# Patient Record
Sex: Male | Born: 2000 | Race: Black or African American | Hispanic: No | Marital: Single | Smoking: Never smoker
Health system: Southern US, Community
[De-identification: ages and names within clinical notes are randomized; demographics above are authoritative.]

---

## 2022-01-01 ENCOUNTER — Encounter
Admission: EM | Disposition: A | Discharge status: Home / Self Care | Payer: Self-pay | Source: Home / Self Care | Attending: Emergency Medicine

## 2022-01-01 ENCOUNTER — Ambulatory Visit
Admission: EM | Admit: 2022-01-01 | Discharge: 2022-01-01 | Disposition: A | Discharge status: Home / Self Care | Payer: 59 | Attending: Emergency Medicine | Admitting: Emergency Medicine

## 2022-01-01 ENCOUNTER — Emergency Department: Payer: 59

## 2022-01-01 ENCOUNTER — Emergency Department: Payer: 59 | Admitting: Anesthesiology

## 2022-01-01 ENCOUNTER — Other Ambulatory Visit: Payer: Self-pay

## 2022-01-01 ENCOUNTER — Encounter: Payer: Self-pay | Admitting: Emergency Medicine

## 2022-01-01 DIAGNOSIS — N4400 Torsion of testis, unspecified: Secondary | ICD-10-CM | POA: Diagnosis not present

## 2022-01-01 DIAGNOSIS — R112 Nausea with vomiting, unspecified: Secondary | ICD-10-CM | POA: Diagnosis not present

## 2022-01-01 DIAGNOSIS — Z20822 Contact with and (suspected) exposure to covid-19: Secondary | ICD-10-CM | POA: Insufficient documentation

## 2022-01-01 DIAGNOSIS — N50812 Left testicular pain: Secondary | ICD-10-CM | POA: Insufficient documentation

## 2022-01-01 DIAGNOSIS — R609 Edema, unspecified: Secondary | ICD-10-CM

## 2022-01-01 HISTORY — PX: TESTICLE TORSION REDUCTION: SHX795

## 2022-01-01 LAB — BASIC METABOLIC PANEL
Anion gap: 8 (ref 5–15)
BUN: 13 mg/dL (ref 6–20)
CO2: 25 mmol/L (ref 22–32)
Calcium: 9 mg/dL (ref 8.9–10.3)
Chloride: 106 mmol/L (ref 98–111)
Creatinine, Ser: 1.21 mg/dL (ref 0.61–1.24)
GFR, Estimated: >60 mL/min (ref >60)
Glucose, Bld: 145 mg/dL — ABNORMAL HIGH (ref 70–99)
Potassium: 4.2 mmol/L (ref 3.5–5.1)
Sodium: 139 mmol/L (ref 135–145)

## 2022-01-01 LAB — CBC WITH DIFFERENTIAL/PLATELET
Abs Immature Granulocytes: 0.06 10*3/uL (ref 0.00–0.07)
Basophils Absolute: 0.1 10*3/uL (ref 0.0–0.1)
Basophils Relative: 1 %
Eosinophils Absolute: 0.1 10*3/uL (ref 0.0–0.5)
Eosinophils Relative: 1 %
HCT: 45.6 % (ref 39.0–52.0)
Hemoglobin: 14.6 g/dL (ref 13.0–17.0)
Immature Granulocytes: 1 %
Lymphocytes Relative: 14 %
Lymphs Abs: 1.2 10*3/uL (ref 0.7–4.0)
MCH: 27.4 pg (ref 26.0–34.0)
MCHC: 32 g/dL (ref 30.0–36.0)
MCV: 85.7 fL (ref 80.0–100.0)
Monocytes Absolute: 0.4 10*3/uL (ref 0.1–1.0)
Monocytes Relative: 5 %
Neutro Abs: 6.8 10*3/uL (ref 1.7–7.7)
Neutrophils Relative %: 78 %
Platelets: 197 10*3/uL (ref 150–400)
RBC: 5.32 MIL/uL (ref 4.22–5.81)
RDW: 12.4 % (ref 11.5–15.5)
WBC: 8.7 10*3/uL (ref 4.0–10.5)
nRBC: 0 % (ref 0.0–0.2)

## 2022-01-01 LAB — URINALYSIS, ROUTINE W REFLEX MICROSCOPIC
Bacteria, UA: NONE SEEN
Bilirubin Urine: NEGATIVE
Glucose, UA: NEGATIVE mg/dL
Hgb urine dipstick: NEGATIVE
Ketones, ur: NEGATIVE mg/dL
Leukocytes,Ua: NEGATIVE
Nitrite: NEGATIVE
Protein, ur: 30 mg/dL — AB
Specific Gravity, Urine: 1.02 (ref 1.005–1.030)
pH: 9 — ABNORMAL HIGH (ref 5.0–8.0)

## 2022-01-01 LAB — TYPE AND SCREEN
ABO/RH(D): O POS
Antibody Screen: NEGATIVE

## 2022-01-01 LAB — CHLAMYDIA/NGC RT PCR (ARMC ONLY)
Chlamydia Tr: NOT DETECTED
N gonorrhoeae: NOT DETECTED

## 2022-01-01 LAB — PROTIME-INR
INR: 1 (ref 0.8–1.2)
Prothrombin Time: 13.1 seconds (ref 11.4–15.2)

## 2022-01-01 LAB — RESP PANEL BY RT-PCR (FLU A&B, COVID) ARPGX2
Influenza A by PCR: NEGATIVE
Influenza B by PCR: NEGATIVE
SARS Coronavirus 2 by RT PCR: NEGATIVE

## 2022-01-01 SURGERY — TESTICULAR TORSION REPAIR
Anesthesia: General | Laterality: Left

## 2022-01-01 MED ORDER — SUCCINYLCHOLINE CHLORIDE 200 MG/10ML IV SOSY
PREFILLED_SYRINGE | INTRAVENOUS | Status: DC | PRN
  Administered 2022-01-01: 100 mg via INTRAVENOUS

## 2022-01-01 MED ORDER — DEXAMETHASONE SODIUM PHOSPHATE 10 MG/ML IJ SOLN
INTRAMUSCULAR | Status: DC | PRN
Start: 2022-01-01 — End: 2022-01-01
  Administered 2022-01-01: 10 mg via INTRAVENOUS

## 2022-01-01 MED ORDER — HYDROMORPHONE HCL 1 MG/ML IJ SOLN
1.0000 mg | Freq: Once | INTRAMUSCULAR | Status: AC
  Administered 2022-01-01: 1 mg via INTRAVENOUS
  Filled 2022-01-01: qty 1

## 2022-01-01 MED ORDER — ONDANSETRON HCL 4 MG/2ML IJ SOLN
4.0000 mg | Freq: Once | INTRAMUSCULAR | Status: AC
  Administered 2022-01-01: 4 mg via INTRAVENOUS
  Filled 2022-01-01: qty 2

## 2022-01-01 MED ORDER — LIDOCAINE HCL (CARDIAC) PF 100 MG/5ML IV SOSY
PREFILLED_SYRINGE | INTRAVENOUS | Status: DC | PRN
Start: 2022-01-01 — End: 2022-01-01
  Administered 2022-01-01: 100 mg via INTRAVENOUS

## 2022-01-01 MED ORDER — MIDAZOLAM HCL 2 MG/2ML IJ SOLN
INTRAMUSCULAR | Status: DC | PRN
  Administered 2022-01-01: 2 mg via INTRAVENOUS

## 2022-01-01 MED ORDER — BUPIVACAINE HCL 0.5 % IJ SOLN
INTRAMUSCULAR | Status: DC | PRN
  Administered 2022-01-01: 10 mL

## 2022-01-01 MED ORDER — FENTANYL CITRATE (PF) 100 MCG/2ML IJ SOLN: 25.0000 ug | INTRAMUSCULAR | Status: DC | PRN

## 2022-01-01 MED ORDER — DEXMEDETOMIDINE (PRECEDEX) IN NS 20 MCG/5ML (4 MCG/ML) IV SYRINGE
PREFILLED_SYRINGE | INTRAVENOUS | Status: DC | PRN
  Administered 2022-01-01: 4 ug via INTRAVENOUS
  Administered 2022-01-01: 8 ug via INTRAVENOUS

## 2022-01-01 MED ORDER — FENTANYL CITRATE (PF) 100 MCG/2ML IJ SOLN
INTRAMUSCULAR | Status: AC
  Filled 2022-01-01: qty 2

## 2022-01-01 MED ORDER — ACETAMINOPHEN 10 MG/ML IV SOLN
INTRAVENOUS | Status: AC
  Filled 2022-01-01: qty 100

## 2022-01-01 MED ORDER — PROPOFOL 10 MG/ML IV BOLUS
INTRAVENOUS | Status: DC | PRN
  Administered 2022-01-01: 50 mg via INTRAVENOUS
  Administered 2022-01-01: 150 mg via INTRAVENOUS

## 2022-01-01 MED ORDER — SUGAMMADEX SODIUM 200 MG/2ML IV SOLN
INTRAVENOUS | Status: DC | PRN
Start: 2022-01-01 — End: 2022-01-01
  Administered 2022-01-01: 180 mg via INTRAVENOUS

## 2022-01-01 MED ORDER — KETOROLAC TROMETHAMINE 30 MG/ML IJ SOLN
INTRAMUSCULAR | Status: DC | PRN
  Administered 2022-01-01: 30 mg via INTRAVENOUS

## 2022-01-01 MED ORDER — ROCURONIUM BROMIDE 100 MG/10ML IV SOLN
INTRAVENOUS | Status: DC | PRN
  Administered 2022-01-01: 30 mg via INTRAVENOUS

## 2022-01-01 MED ORDER — ONDANSETRON HCL 4 MG/2ML IJ SOLN
INTRAMUSCULAR | Status: DC | PRN
  Administered 2022-01-01: 4 mg via INTRAVENOUS

## 2022-01-01 MED ORDER — MIDAZOLAM HCL 2 MG/2ML IJ SOLN
INTRAMUSCULAR | Status: AC
  Filled 2022-01-01: qty 2

## 2022-01-01 MED ORDER — 0.9 % SODIUM CHLORIDE (POUR BTL) OPTIME
TOPICAL | Status: DC | PRN
Start: 2022-01-01 — End: 2022-01-01
  Administered 2022-01-01: 100 mL

## 2022-01-01 MED ORDER — SODIUM CHLORIDE 0.9 % IV SOLN
INTRAVENOUS | Status: DC
Start: 2022-01-01 — End: 2022-01-01

## 2022-01-01 MED ORDER — HYDROCODONE-ACETAMINOPHEN 5-325 MG PO TABS: 1.0000 | ORAL_TABLET | Freq: Four times a day (QID) | ORAL | 0 refills | Status: DC | PRN

## 2022-01-01 MED ORDER — FENTANYL CITRATE (PF) 100 MCG/2ML IJ SOLN
INTRAMUSCULAR | Status: DC | PRN
  Administered 2022-01-01 (×2): 50 ug via INTRAVENOUS

## 2022-01-01 MED ORDER — ACETAMINOPHEN 10 MG/ML IV SOLN
INTRAVENOUS | Status: DC | PRN
  Administered 2022-01-01: 1000 mg via INTRAVENOUS

## 2022-01-01 MED ORDER — MORPHINE SULFATE (PF) 4 MG/ML IV SOLN
4.0000 mg | Freq: Once | INTRAVENOUS | Status: AC
  Administered 2022-01-01: 4 mg via INTRAVENOUS
  Filled 2022-01-01: qty 1

## 2022-01-01 MED ORDER — ONDANSETRON HCL 4 MG/2ML IJ SOLN: 4.0000 mg | Freq: Once | INTRAMUSCULAR | Status: DC | PRN

## 2022-01-01 MED ORDER — DOCUSATE SODIUM 100 MG PO CAPS: 100.0000 mg | ORAL_CAPSULE | Freq: Two times a day (BID) | ORAL | 0 refills | Status: DC

## 2022-01-01 MED ORDER — PROPOFOL 500 MG/50ML IV EMUL
INTRAVENOUS | Status: AC
  Filled 2022-01-01: qty 50

## 2022-01-01 MED ORDER — CEFAZOLIN SODIUM-DEXTROSE 1-4 GM/50ML-% IV SOLN
INTRAVENOUS | Status: DC | PRN
  Administered 2022-01-01: 2 g via INTRAVENOUS

## 2022-01-01 SURGICAL SUPPLY — 32 items
BLADE SURG 15 STRL LF DISP TIS (BLADE) ×1 IMPLANT
BLADE SURG 15 STRL SS (BLADE) ×1
CHLORAPREP W/TINT 26 (MISCELLANEOUS) ×2 IMPLANT
DERMABOND ADVANCED (GAUZE/BANDAGES/DRESSINGS) ×1
DERMABOND ADVANCED .7 DNX12 (GAUZE/BANDAGES/DRESSINGS) ×1 IMPLANT
DRAPE LAPAROTOMY 77X122 PED (DRAPES) ×2 IMPLANT
DRSG GAUZE FLUFF 36X18 (GAUZE/BANDAGES/DRESSINGS) ×2 IMPLANT
ELECT REM PT RETURN 9FT ADLT (ELECTROSURGICAL) ×2
ELECTRODE REM PT RTRN 9FT ADLT (ELECTROSURGICAL) ×1 IMPLANT
GAUZE 4X4 16PLY ~~LOC~~+RFID DBL (SPONGE) ×2 IMPLANT
GLOVE SURG ENC MOIS LTX SZ6.5 (GLOVE) ×2 IMPLANT
GLOVE SURG UNDER POLY LF SZ6.5 (GLOVE) ×2 IMPLANT
GOWN STRL REUS W/ TWL LRG LVL3 (GOWN DISPOSABLE) ×2 IMPLANT
GOWN STRL REUS W/TWL LRG LVL3 (GOWN DISPOSABLE) ×2
KIT TURNOVER KIT A (KITS) ×2 IMPLANT
MANIFOLD NEPTUNE II (INSTRUMENTS) ×2 IMPLANT
NDL HYPO 25X1 1.5 SAFETY (NEEDLE) ×1 IMPLANT
NEEDLE HYPO 25X1 1.5 SAFETY (NEEDLE) ×2 IMPLANT
NS IRRIG 500ML POUR BTL (IV SOLUTION) ×2 IMPLANT
PACK BASIN MINOR ARMC (MISCELLANEOUS) ×2 IMPLANT
SPONGE KITTNER 5P (MISCELLANEOUS) ×2 IMPLANT
SUPPORETR ATHLETIC LG (MISCELLANEOUS) ×1 IMPLANT
SUPPORT SCROTAL MED ADLT STRP (MISCELLANEOUS) ×1 IMPLANT
SUPPORTER ATHLETIC LG (MISCELLANEOUS)
SUT CHROMIC 3 0 PS 2 (SUTURE) ×2 IMPLANT
SUT CHROMIC 4 0 RB 1X27 (SUTURE) ×2 IMPLANT
SUT PROLENE 4-0 (SUTURE) ×2
SUT PROLENE 4-0 RB1 30XMFL BLU (SUTURE) ×2
SUT VICRYL 3-0 27IN (SUTURE) ×2 IMPLANT
SUTURE PROLEN 4-0 RB1 30XMFL (SUTURE) IMPLANT
SYR 10ML LL (SYRINGE) ×2 IMPLANT
WATER STERILE IRR 500ML POUR (IV SOLUTION) ×2 IMPLANT

## 2022-01-01 NOTE — Op Note (Signed)
Date of procedure: 01/01/22 ? ?Preoperative diagnosis:  ?Left testicular torsion ? ?Postoperative diagnosis:  ?Same as above ? ?Procedure: ?Scrotal exploration ?Bilateral orchidopexy ? ?Surgeon: Vanna Scotland, MD ? ?Anesthesia: General ? ?Complications: None ? ?Intraoperative findings: Ischemic left testicle with at least 180 degree twist in cord.  Slow but present revascularization of left testicle appreciated. ? ?EBL: Minimal ? ?Specimens: None ? ?Drains: None ? ?Indication: Joseph Mullen is a 21 y.o. patient with acute onset left testicular pain at 2:40 AM which awoke him from his sleep.  Work-up including exam and ultrasound consistent with left testicular torsion.  After reviewing the management options for treatment, he elected to proceed with the above surgical procedure(s). We have discussed the potential benefits and risks of the procedure, side effects of the proposed treatment, the likelihood of the patient achieving the goals of the procedure, and any potential problems that might occur during the procedure or recuperation. Informed consent has been obtained. ? ?Description of procedure: ? ?The patient was taken to the operating room and general anesthesia was induced.  The patient was placed in the supine position, prepped and draped in the usual sterile fashion, and preoperative antibiotics were administered. A preoperative time-out was performed.  ? ?An exam under anesthesia was performed.  This revealed a high riding left testicle which was firm with an abnormal lie.  I attempted a open book maneuver which did not seem successful.  The median raphae was then instilled with half percent Marcaine.  A knife was used to incise an approximately 4 cm vertical incision along the raphae.  The subcutaneous tissues were opened using Bovie electrocautery.  I deviated toward the left testicle eventually identifying tunica vaginalis, and opening this.  Notably, there is additional fluid around the left testicle  which was blue and he with a knotted cord.  There is at least 180 degree twist in the testicle itself although possibly was more previously prior to the open book maneuver.  The testicle was placed in a warm cloth and laid off to the side. ? ?Attention was then turned to the right side.  I opened the septum and through the subcutaneous tissues until the right testicle was delivered into the field.  It was healthy and normal.  I used a three-point fixation using a 4-0 Prolene from the lateral aspect of the testicle and inferiorly.  The testicle was then repositioned back into the right hemiscrotum and tied down in the normal anatomic position.  The wound was irrigated.   ? ?Attention was turned back to the left side.  Upon examining the left testicle, the hue of the testicle was more pink and appeared to be revascularizing appropriately.  I used the same aforementioned technique for three-point fixation using a same 4-0 Prolene.  Notably, prior to returning each of these testicles back into their normal anatomic position, I did remove the appendix testis bilaterally using Bovie electrocautery.  Doing was then copiously irrigated.  Hemostasis was excellent.  I closed dartos using a 3-0 Vicryl suture in a running fashion.  Then closed the skin using 4-0 chromic suture in interrupted fashion.  Additional Dermabond was applied.  Scrotal fluffs and a scrotal support device were applied.  The patient was then cleaned and dried, repositioned to supine position, reversed myesthesia, taken the PACU in stable condition. ? ?Plan: Postoperative restrictions and wound care were discussed.  We will see him in 4 weeks for wound check. ? ?Vanna Scotland, M.D. ? ?

## 2022-01-01 NOTE — Anesthesia Procedure Notes (Addendum)
Procedure Name: Intubation ?Date/Time: 01/01/2022 6:01 AM ?Performed by: Waldo Laine, CRNA ?Pre-anesthesia Checklist: Patient identified, Patient being monitored, Timeout performed, Emergency Drugs available and Suction available ?Patient Re-evaluated:Patient Re-evaluated prior to induction ?Oxygen Delivery Method: Circle system utilized ?Preoxygenation: Pre-oxygenation with 100% oxygen ?Induction Type: IV induction ?Ventilation: Mask ventilation without difficulty ?Laryngoscope Size: 3 and McGraph ?Grade View: Grade I ?Tube type: Oral ?Tube size: 7.5 mm ?Number of attempts: 1 ?Airway Equipment and Method: Stylet ?Placement Confirmation: ETT inserted through vocal cords under direct vision, positive ETCO2 and breath sounds checked- equal and bilateral ?Secured at: 22 cm ?Tube secured with: Tape ?Dental Injury: Teeth and Oropharynx as per pre-operative assessment  ? ? ? ? ?

## 2022-01-01 NOTE — Transfer of Care (Signed)
Immediate Anesthesia Transfer of Care Note ? ?Patient: Joseph Mullen ? ?Procedure(s) Performed: TESTICULAR TORSION REPAIR (Left) ? ?Patient Location: PACU ? ?Anesthesia Type:General ? ?Level of Consciousness: awake, alert , oriented and patient cooperative ? ?Airway & Oxygen Therapy: Patient Spontanous Breathing and Patient connected to face mask oxygen ? ?Post-op Assessment: Report given to RN and Post -op Vital signs reviewed and stable ? ?Post vital signs: Reviewed and stable ? ?Last Vitals:  ?Vitals Value Taken Time  ?BP 128/51 01/01/22 0700  ?Temp    ?Pulse 81 01/01/22 0701  ?Resp 9 01/01/22 0701  ?SpO2 100 % 01/01/22 0701  ?Vitals shown include unvalidated device data. ? ?Last Pain:  ?Vitals:  ? 01/01/22 0350  ?TempSrc: Oral  ?   ? ?  ? ?Complications: No notable events documented. ?

## 2022-01-01 NOTE — ED Provider Notes (Addendum)
[facility] Provider Note    Event Date/Time   First MD Initiated Contact with Patient 01/01/22 [phone]     (approximate)   History   Testicle Pain   HPI  Joseph Mullen is a 21 y.o. male with no significant past medical history who presents to the emergency department with sudden onset left testicular pain and swelling that started when he woke up at 2:40 AM.  States he went to bed without any symptoms.  Denies any injury to this area.  Is having some discomfort with urination.  No penile discharge, hematuria.  No abdominal pain.  States he has been vomiting.  No fever.  He states he has had pain like this before but it normally resolves after 20 to 30 minutes and he has never had to come to the emergency department for it.  He denies any history of STDs.   History provided by patient.    History reviewed. No pertinent past medical history.  History reviewed. No pertinent surgical history.  MEDICATIONS:  Prior to Admission medications   Not on File    Physical Exam   Triage Vital Signs: ED Triage Vitals  Enc Vitals Group     BP 01/01/22 0350 (!) 147/82     Pulse Rate 01/01/22 0350 (!) 59     Resp 01/01/22 0350 17     Temp 01/01/22 0350 98 F (36.7 C)     Temp Source 01/01/22 0350 Oral     SpO2 01/01/22 0350 100 %     Weight 01/01/22 0348 160 lb (72.6 kg)     Height 01/01/22 0348 5\' 11"  (1.803 m)     Head Circumference --      Peak Flow --      Pain Score --      Pain Loc --      Pain Edu? --      Excl. in GC? --     Most recent vital signs: Vitals:   01/01/22 0350  BP: (!) 147/82  Pulse: (!) 59  Resp: 17  Temp: 98 F (36.7 C)  SpO2: 100%    CONSTITUTIONAL: Alert and oriented and responds appropriately to questions.  Appears uncomfortable HEAD: Normocephalic, atraumatic EYES: Conjunctivae clear, pupils appear equal, sclera nonicteric ENT: normal nose; moist mucous membranes NECK: Supple, normal ROM CARD: RRR; S1 and S2  appreciated; no murmurs, no clicks, no rubs, no gallops RESP: Normal chest excursion without splinting or tachypnea; breath sounds clear and equal bilaterally; no wheezes, no rhonchi, no rales, no hypoxia or respiratory distress, speaking full sentences ABD/GI: Normal bowel sounds; non-distended; soft, non-tender, no rebound, no guarding, no peritoneal signs GU:  Normal external genitalia, circumcised male, normal penile shaft, no blood or discharge at the urethral meatus, patient has significant left-sided testicular tenderness without masses.  His left testicle appears to be high riding.  No scrotal swelling, redness or warmth.  No hernias appreciated, 2+ femoral pulses bilaterally; no perineal erythema, warmth, subcutaneous air or crepitus. Chaperone present for exam. BACK: The back appears normal EXT: Normal ROM in all joints; no deformity noted, no edema; no cyanosis SKIN: Normal color for age and race; warm; no rash on exposed skin NEURO: Moves all extremities equally, normal speech PSYCH: The patient's mood and manner are appropriate.   ED Results / Procedures / Treatments   LABS: (all labs ordered are listed, but only abnormal results are displayed) Labs Reviewed  BASIC METABOLIC PANEL - Abnormal; Notable for the following  components:      Result Value   Glucose, Bld 145 (*)    All other components within normal limits  CHLAMYDIA/NGC RT PCR ([facility] ONLY)            RESP PANEL BY RT-PCR (FLU A&B, COVID) ARPGX2  CBC WITH DIFFERENTIAL/PLATELET  PROTIME-INR  URINALYSIS, ROUTINE W REFLEX MICROSCOPIC  TYPE AND SCREEN  TYPE AND SCREEN     EKG:   RADIOLOGY: My personal review and interpretation of imaging: Ultrasound confirms left testicular torsion.  I have personally reviewed all radiology reports.   US SCROTUM W/DOPPLER  Result Date: 01/01/2022 CLINICAL DATA:  Left testicular pain and swelling. Sudden onset this morning. EXAM: SCROTAL ULTRASOUND DOPPLER ULTRASOUND OF THE  TESTICLES TECHNIQUE: Complete ultrasound examination of the testicles, epididymis, and other scrotal structures was performed. Color and spectral Doppler ultrasound were also utilized to evaluate blood flow to the testicles. COMPARISON:  None. FINDINGS: Right testicle Measurements: 4.0 x 1.7 x 2.2 cm. No mass or microlithiasis visualized. Left testicle Measurements: 4.0 x 2.7 x 2.5 cm. No mass or microlithiasis visualized. Right epididymis:  Normal in size and appearance. Left epididymis:  Normal in size and appearance. Hydrocele:  Small volume fluid noted left hemiscrotum. Varicocele:  None visualized. Pulsed Doppler interrogation of both testes demonstrates normal low resistance arterial and venous waveforms in the right testicle. No evidence for flow signal in the left testicular parenchyma on color or power doppler imaging. No measurable arterial or venous waveform within the parenchyma of the left testicle on Pulsed Doppler evaluation. IMPRESSION: 1. Lack of detectable blood flow in the left testicle, compatible with torsion. 2. Unremarkable appearance of the right testicle. Critical Value/emergent results were called by telephone at the time of interpretation on 01/01/2022 at 5:10 am to provider [facility] , who verbally acknowledged these results. Electronically Signed   By: Kennith Center M.D.   On: 01/01/2022 05:14     PROCEDURES:  Critical Care performed: Yes, see critical care procedure note(s)   CRITICAL CARE Performed by: Baxter Hire Jessikah Dicker   Total critical care time: 45 minutes  Critical care time was exclusive of separately billable procedures and treating other patients.  Critical care was necessary to treat or prevent imminent or life-threatening deterioration.  Critical care was time spent personally by me on the following activities: development of treatment plan with patient and/or surrogate as well as nursing, discussions with consultants, evaluation of patient's response to  treatment, examination of patient, obtaining history from patient or surrogate, ordering and performing treatments and interventions, ordering and review of laboratory studies, ordering and review of radiographic studies, pulse oximetry and re-evaluation of patient's condition.   Procedures    IMPRESSION / MDM / ASSESSMENT AND PLAN / ED COURSE  I reviewed the triage vital signs and the nursing notes.    Patient seen immediately in an ER room after checking in in triage for left testicular pain.  Symptoms very concerning for testicular torsion.  Nurse, charge nurse and [place] tech aware of patient and need for rapid work up.    DIFFERENTIAL DIAGNOSIS (includes but not limited to):   Testicular torsion, orchitis, epididymitis, UTI, STD, urethritis, kidney stone   PLAN: Patient's symptoms very concerning for testicular torsion.  Symptoms started at 2:40 AM.  He has been n.p.o. since 6 PM.  We will obtain screening labs, COVID swab.  Will obtain urinalysis, gonorrhea and chlamydia test and testicular ultrasound.  Will give pain and nausea medicine.   MEDICATIONS GIVEN IN  ED: Medications  0.9 %  sodium chloride infusion ( Intravenous New Bag/Given 01/01/22 0425)  morphine (PF) 4 MG/ML injection 4 mg (4 mg Intravenous Given 01/01/22 0424)  ondansetron (ZOFRAN) injection 4 mg (4 mg Intravenous Given 01/01/22 0425)  HYDROmorphone (DILAUDID) injection 1 mg (1 mg Intravenous Given 01/01/22 0511)     ED COURSE:   4:02 AM  Spoke with Lupita Leash with [place] and she will come to see patient now.  4:06 AM  Updated patient's nurse regarding my concern and his acuity.  4:20 AM  [place] at bedside with patient.   4:50 AM  [place] confirms no blood flow to the left testicle.  I have reviewed the images myself with ultrasonographer.  Awaiting official radiology read.  Will contact urology on-call.  5:20 AM  Pt taken upstairs to the operating room.  No improvement in pain after morphine.  Given Dilaudid.  Labs reviewed.  Normal  white blood cell count, hemoglobin, electrolytes, renal function.  COVID and flu negative.  CONSULTS: 4:51 AM  Discussed with Dr. Apolinar Junes on-call for urology for left testicular torsion.  Patient is n.p.o. currently.  Updated patient and mother at bedside.  Patient will be taken emergently to the operating room.  I reviewed all nursing notes and pertinent previous records as available.  I have reviewed and interpreted any and all EKGs, lab and urine results, imaging and radiology reports (as available).    OUTSIDE RECORDS REVIEWED: Reviewed patient's last office visit on 08/09/2021 with Andree Moro.         FINAL CLINICAL IMPRESSION(S) / ED DIAGNOSES   Final diagnoses:  Swelling  Left testicular pain  Left testicular torsion     Rx / DC Orders   ED Discharge Orders     None        Note:  This document was prepared using Dragon voice recognition software and may include unintentional dictation errors.      Kandi Brusseau, Layla Maw, DO 01/01/22 [phone]

## 2022-01-01 NOTE — Anesthesia Preprocedure Evaluation (Addendum)
Anesthesia Evaluation  ?Patient identified by MRN, date of birth, ID band ?Patient awake ? ? ? ?Reviewed: ?Allergy & Precautions, H&P , NPO status , Patient's Chart, lab work & pertinent test results, reviewed documented beta blocker date and time  ? ?History of Anesthesia Complications ?Negative for: history of anesthetic complications ? ?Airway ?Mallampati: I ? ?TM Distance: >3 FB ?Neck ROM: full ? ? ? Dental ? ?(+) Caps, Dental Advidsory Given, Chipped, Teeth Intact ?Permanent retainer on top and bottom.:   ?Pulmonary ?neg pulmonary ROS,  ?  ?Pulmonary exam normal ?breath sounds clear to auscultation ? ? ? ? ? ? Cardiovascular ?Exercise Tolerance: Good ?negative cardio ROS ?Normal cardiovascular exam ?Rhythm:regular Rate:Normal ? ? ?  ?Neuro/Psych ?negative neurological ROS ? negative psych ROS  ? GI/Hepatic ?negative GI ROS, Neg liver ROS,   ?Endo/Other  ?negative endocrine ROS ? Renal/GU ?negative Renal ROS  ?negative genitourinary ?  ?Musculoskeletal ? ? Abdominal ?  ?Peds ? Hematology ?negative hematology ROS ?(+)   ?Anesthesia Other Findings ?History reviewed. No pertinent past medical history. ? ? Reproductive/Obstetrics ?negative OB ROS ? ?  ? ? ? ? ? ? ? ? ? ? ? ? ? ?  ?  ? ? ? ? ? ? ? ? ?Anesthesia Physical ?Anesthesia Plan ? ?ASA: 1 and emergent ? ?Anesthesia Plan: General  ? ?Post-op Pain Management:   ? ?Induction: Intravenous, Rapid sequence and Cricoid pressure planned ? ?PONV Risk Score and Plan: 2 and Ondansetron, Dexamethasone, Midazolam and Treatment may vary due to age or medical condition ? ?Airway Management Planned: Oral ETT ? ?Additional Equipment:  ? ?Intra-op Plan:  ? ?Post-operative Plan: Extubation in OR ? ?Informed Consent: I have reviewed the patients History and Physical, chart, labs and discussed the procedure including the risks, benefits and alternatives for the proposed anesthesia with the patient or authorized representative who has indicated  his/her understanding and acceptance.  ? ? ? ?Dental Advisory Given ? ?Plan Discussed with: Anesthesiologist, CRNA and Surgeon ? ?Anesthesia Plan Comments:   ? ? ? ? ? ?Anesthesia Quick Evaluation ? ?

## 2022-01-01 NOTE — H&P (Signed)
?    ? ?Urology Consult ? ?I have been asked to see the patient by Dr. Elesa Massed, for evaluation and management of left testicular torsion. ? ?Chief Complaint: Left scrotal pain ? ?History of Present Illness: Joseph Mullen is a 21 y.o. year old male who awoken suddenly at 2:40 AM with severe left testicular pain, abdominal pain nausea vomiting. ? ?In the emergency room, exam by emergency room provider was concerning for testicular torsion and scrotal ultrasound confirmed this diagnosis. ? ?Joseph Mullen does have a personal history of intermittent testicular pain but never lasted more than 30 minutes.  He also has a family history of this, recently happened to a cousin. ? ?He is accompanied in the preoperative holding area by his mother. ? ?History reviewed. No pertinent past medical history. ? ?History reviewed. No pertinent surgical history. ? ?Home Medications:  ?Current Meds  ?Medication Sig  ? EPINEPHrine 0.3 mg/0.3 mL IJ SOAJ injection Inject into the muscle.  ? ? ?Allergies:  ?Allergies  ?Allergen Reactions  ? Fire [employer] Anaphylaxis  ?  May not be allergen but he did have anaphylaxis on 01/22/2015 ?May not be allergen but he did have anaphylaxis on 01/22/2015 ?  ? Peanut-Containing Drug Products Anaphylaxis, Hives and Shortness Of Breath  ? Other Hives  ? Peanut Oil   ? ? ?History reviewed. No pertinent family history. ? ?Social History:  reports that he has never smoked. He has never used smokeless tobacco. No history on file for alcohol use and drug use. ? ?ROS: ?A complete review of systems was performed.  All systems are negative except for pertinent findings as noted. ? ?Physical Exam:  ?Vital signs in last 24 hours: ?Temp:  [98 ?F (36.7 ?C)] 98 ?F (36.7 ?C) (03/02 0350) ?Pulse Rate:  [59] 59 (03/02 0350) ?Resp:  [17] 17 (03/02 0350) ?BP: (147)/(82) 147/82 (03/02 0350) ?SpO2:  [100 %] 100 % (03/02 0350) ?Weight:  [72.6 kg] 72.6 kg (03/02 0348) ?Constitutional:  Alert and oriented, No acute distress ?HEENT: [place] AT, moist  mucus membranes.  Trachea midline, no masses ?Cardiovascular: Regular rate and rhythm, no clubbing, cyanosis, or edema. ?Respiratory: Normal respiratory effort, lungs clear bilaterally ?GI: Abdomen is soft, nontender, nondistended, no abdominal masses ?GU: Deferred to intraoperative based on emergency room physicians assessment as well as complete absence of blood flow on scrotal ultrasound, pathognomonic for testicular torsion.  Exam findings would not change plan for scrotal exploration. ?Neurologic: Grossly intact, no focal deficits, moving all 4 extremities ?Psychiatric: Normal mood and affect ? ? ?Laboratory Data:  ?Recent Labs  ?  01/01/22 ?0407  ?WBC 8.7  ?HGB 14.6  ?HCT 45.6  ? ?Recent Labs  ?  01/01/22 ?0407  ?NA 139  ?K 4.2  ?CL 106  ?CO2 25  ?GLUCOSE 145*  ?BUN 13  ?CREATININE 1.21  ?CALCIUM 9.0  ? ?Recent Labs  ?  01/01/22 ?0407  ?INR 1.0  ? ? ?Radiologic Imaging: ?US SCROTUM W/DOPPLER ? ?Result Date: 01/01/2022 ?CLINICAL DATA:  Left testicular pain and swelling. Sudden onset this morning. EXAM: SCROTAL ULTRASOUND DOPPLER ULTRASOUND OF THE TESTICLES TECHNIQUE: Complete ultrasound examination of the testicles, epididymis, and other scrotal structures was performed. Color and spectral Doppler ultrasound were also utilized to evaluate blood flow to the testicles. COMPARISON:  None. FINDINGS: Right testicle Measurements: 4.0 x 1.7 x 2.2 cm. No mass or microlithiasis visualized. Left testicle Measurements: 4.0 x 2.7 x 2.5 cm. No mass or microlithiasis visualized. Right epididymis:  Normal in size and appearance. Left epididymis:  Normal in size and appearance. Hydrocele:  Small volume fluid noted left hemiscrotum. Varicocele:  None visualized. Pulsed Doppler interrogation of both testes demonstrates normal low resistance arterial and venous waveforms in the right testicle. No evidence for flow signal in the left testicular parenchyma on color or power doppler imaging. No measurable arterial or venous waveform  within the parenchyma of the left testicle on Pulsed Doppler evaluation. IMPRESSION: 1. Lack of detectable blood flow in the left testicle, compatible with torsion. 2. Unremarkable appearance of the right testicle. Critical Value/emergent results were called by telephone at the time of interpretation on 01/01/2022 at 5:10 am to provider [facility] , who verbally acknowledged these results. Electronically Signed   By: Kennith Center M.D.   On: 01/01/2022 05:14   ? ?Scrotal ultrasound was personally reviewed.  Agree with radiologic dictation. ? ?Impression/ Plan: ? ?1.  Left testicular torsion-patient is accompanied by his mother is counseled for emergent scrotal exploration, bilateral orchidopexy and possible left orchiectomy.  We discussed the chances of testicular loss at this point which are relatively low given its within a 6-hour window however depending on the actual time of onset, if the testicle fails to revascularize, there is in fact a risk of testicular loss.  We discussed risk and benefits.  We discussed the postoperative care.  He will be able to go home following the procedure as long as pain is well controlled.  All questions were answered. ? ?01/01/2022, 5:46 AM  ?Vanna Scotland,  MD ? ? ? ? ? ? ? ?

## 2022-01-01 NOTE — Discharge Instructions (Addendum)
Take it easy for the next 3 to 5 days. ? ?No exercising for 14 days ? ?Use cool compress or ice for up to 20 minutes as needed.  Recommend over-the-counter ibuprofen as first-line pain control, use narcotics for breakthrough only. ? ?Continue to wear scrotal support device as long as possible until scrotum is completely healed or tight fitting compressive underwear.AMBULATORY SURGERY  ?DISCHARGE INSTRUCTIONS ? ? ?The drugs that you were given will stay in your system until tomorrow so for the next 24 hours you should not: ? ?Drive an automobile ?Make any legal decisions ?Drink any alcoholic beverage ? ? ?You may resume regular meals tomorrow.  Today it is better to start with liquids and gradually work up to solid foods. ? ?You may eat anything you prefer, but it is better to start with liquids, then soup and crackers, and gradually work up to solid foods. ? ? ?Please notify your doctor immediately if you have any unusual bleeding, trouble breathing, redness and pain at the surgery site, drainage, fever, or pain not relieved by medication. ? ? ? ?Additional Instructions: ? ? ? ? ? ? ? ?Please contact your physician with any problems or Same Day Surgery at [phone], Monday through Friday 6 am to 4 pm, or [place] [facility] number at [phone].  ?

## 2022-01-01 NOTE — ED Triage Notes (Signed)
Patient ambulatory to triage with steady gait, without difficulty or distress noted; pt reports awoke PTA with left testicular pain & swelling ?

## 2022-01-02 ENCOUNTER — Encounter: Payer: Self-pay | Admitting: Urology

## 2022-01-02 NOTE — Anesthesia Postprocedure Evaluation (Signed)
Anesthesia Post Note ? ?Patient: Joseph Mullen ? ?Procedure(s) Performed: TESTICULAR TORSION REPAIR (Left) ? ?Patient location during evaluation: PACU ?Anesthesia Type: General ?Level of consciousness: awake and alert ?Pain management: pain level controlled ?Vital Signs Assessment: post-procedure vital signs reviewed and stable ?Respiratory status: spontaneous breathing, nonlabored ventilation, respiratory function stable and patient connected to nasal cannula oxygen ?Cardiovascular status: blood pressure returned to baseline and stable ?Postop Assessment: no apparent nausea or vomiting ?Anesthetic complications: no ? ? ?No notable events documented. ? ? ?Last Vitals:  ?Vitals:  ? 01/01/22 0720 01/01/22 0729  ?BP: 134/60 (!) 146/90  ?Pulse: 71 72  ?Resp: 15 20  ?Temp: 36.8 ?C 36.8 ?C  ?SpO2: 100% 100%  ?  ?Last Pain:  ?Vitals:  ? 01/01/22 0729  ?TempSrc: Temporal  ?PainSc: 0-No pain  ? ? ?  ?  ?  ?  ?  ?  ? ?Martha Clan ? ? ? ? ?

## 2022-01-13 ENCOUNTER — Telehealth: Payer: Self-pay | Admitting: *Deleted

## 2022-01-13 NOTE — Telephone Encounter (Signed)
Patient called Triage line reports one of his sutures has loosened and fell out. He denies any redness, drainage or fevers. Has been wearing scrotal support. Explained post operative instructions-denies any swelling, or pain.  Advised to call the office with any worsening symptoms.  ?

## 2022-02-02 NOTE — Progress Notes (Signed)
? ?02/03/22 ?3:55 PM  ? ?Oneal Brandle ?[birth year] ?[id number] ? ?Referring provider:  ?Gloris Manchester, MD ?[address] ?[place],  [place] [address] ?Chief Complaint  ?Patient presents with  ? Routine Post Op  ? ? ? ?HPI: ?Joseph Mullen is a 21 y.o.male  with a personal history of testicular torsion who presents today for a 1 month post-op follow-up with wound check.  ? ?He was admitted into the hospital on 01/01/2022 with sever left testicular pain, abdominal pain, nausea, and vomiting. Exam in ER was concerning for testicular torsion; scrotal ultrasound visualized lack of detectable blood flow in the left testicle, compatible with torsion. Unremarkable appearance of the right testicle.  ? ?He was taken to the OR for scrotal exploration and bilateral orchidopexy. 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  Slow but present revascularization of left testicle appreciated. ? ?He is doing well today.  ? ? ?PMH: ?No past medical history on file. ? ?Surgical History: ?Past Surgical History:  ?Procedure Laterality Date  ? TESTICLE TORSION REDUCTION Left 01/01/2022  ? Procedure: TESTICULAR TORSION REPAIR;  Surgeon: Vanna Scotland, MD;  Location: [facility] ORS;  Service: Urology;  Laterality: Left;  ? ? ?Home Medications:  ?Allergies as of 02/03/2022   ? ?   Reactions  ? Fire [employer] Anaphylaxis  ? May not be allergen but he did have anaphylaxis on 01/22/2015 ?May not be allergen but he did have anaphylaxis on 01/22/2015  ? Peanut-containing Drug Products Anaphylaxis, Hives, Shortness Of Breath  ? Other Hives  ? Peanut Oil   ? ?  ? ?  ?Medication List  ?  ? ?  ? Accurate as of February 03, 2022  3:55 PM. If you have any questions, ask your nurse or doctor.  ?  ?  ? ?  ? ?STOP taking these medications   ? ?docusate sodium 100 MG capsule ?Commonly known as: COLACE ?Stopped by: Vanna Scotland, MD ?  ?EPINEPHrine 0.3 mg/0.3 mL Soaj injection ?Commonly known as: EPI-PEN ?Stopped by:  Vanna Scotland, MD ?  ?HYDROcodone-acetaminophen 5-325 MG tablet ?Commonly known as: NORCO/VICODIN ?Stopped by: Vanna Scotland, MD ?  ? ?  ? ? ?Allergies:  ?Allergies  ?Allergen Reactions  ? Fire [employer] Anaphylaxis  ?  May not be allergen but he did have anaphylaxis on 01/22/2015 ?May not be allergen but he did have anaphylaxis on 01/22/2015 ?  ? Peanut-Containing Drug Products Anaphylaxis, Hives and Shortness Of Breath  ? Other Hives  ? Peanut Oil   ? ? ?Family History: ?No family history on file. ? ?Social History:  reports that he has never smoked. He has never used smokeless tobacco. No history on file for alcohol use and drug use. ? ? ?Physical Exam: ?BP 122/68   Pulse 76   Ht 5\' 11"  (1.803 m)   Wt 160 lb (72.6 kg)   BMI 22.32 kg/m?   ?Constitutional:  Alert and oriented, No acute distress. ?HEENT: [place] AT, moist mucus membranes.  Trachea midline, no masses. ?Cardiovascular: No clubbing, cyanosis, or edema. ?Respiratory: Normal respiratory effort, no increased work of breathing. ?GU: bilateral descended testicle midline scrotal position is healing well with no scrotal edema.  ?Skin: No rashes, bruises or suspicious lesions. ?Neurologic: Grossly intact, no focal deficits, moving all 4 extremities. ?Psychiatric: Normal mood and affect. ? ?Laboratory Data: ? ?Lab Results  ?Component Value Date  ? CREATININE 1.21 01/01/2022  ? ? ?Assessment & Plan:   ? ?Testicular torsion  ?-  S/p bilateral orchidopexy ?-  He is healing well and he is clear to do all activities.  ? ?Follow-up as needed ? ? ?Tawni Millers as a scribe for Vanna Scotland, MD.,have documented all relevant documentation on the behalf of Vanna Scotland, MD,as directed by  Vanna Scotland, MD while in the presence of Vanna Scotland, MD. ? ?I have reviewed the above documentation for accuracy and completeness, and I agree with the above.  ? ?Vanna Scotland, MD ? ?[facility] ?[address], [address] ?[place], [place]  [address] ?[phone] ? ?

## 2022-02-03 ENCOUNTER — Encounter: Payer: Self-pay | Admitting: Urology

## 2022-02-03 ENCOUNTER — Ambulatory Visit (INDEPENDENT_AMBULATORY_CARE_PROVIDER_SITE_OTHER): Payer: 59 | Admitting: Urology

## 2022-02-03 VITALS — BP 122/68 | HR 76 | Ht 71.0 in | Wt 160.0 lb

## 2022-02-03 DIAGNOSIS — N4400 Torsion of testis, unspecified: Secondary | ICD-10-CM

## 2023-01-09 ENCOUNTER — Encounter: Payer: Self-pay | Admitting: Emergency Medicine

## 2023-01-09 ENCOUNTER — Ambulatory Visit
Admission: EM | Admit: 2023-01-09 | Discharge: 2023-01-09 | Disposition: A | Discharge status: Home / Self Care | Payer: 59 | Attending: Family Medicine | Admitting: Family Medicine

## 2023-01-09 DIAGNOSIS — B356 Tinea cruris: Secondary | ICD-10-CM

## 2023-01-09 MED ORDER — CLOTRIMAZOLE 1 % EX CREA: TOPICAL_CREAM | CUTANEOUS | 1 refills | Status: DC

## 2023-01-09 MED ORDER — FLUCONAZOLE 150 MG PO TABS: 150.0000 mg | ORAL_TABLET | ORAL | 0 refills | Status: AC

## 2023-01-09 NOTE — ED Triage Notes (Signed)
Patient states that he has been exercising more and developed a itchy rash in his groin area for the past 3 days. Patient has been putting Aquaphor on the area.  Patient has history of eczema.

## 2023-01-09 NOTE — Discharge Instructions (Addendum)
Stop by the pharmacy to pick up your prescriptions.  Follow up with your primary care provider as needed.  

## 2023-01-09 NOTE — ED Provider Notes (Signed)
[facility]    CSN: [id number] Arrival date & time: 01/09/23  [id number]      History   Chief Complaint Chief Complaint  Patient presents with   Rash    HPI Joseph Mullen is a 22 y.o. male.   HPI  Joseph Mullen presents for groin rash. He has been working out [employer]. He works out in the morning and then showering later in the day.  The rash is itchy. He put some Aquaphor and washing with baby soap.  No other symptoms or concerns.    ***Redness, pain, swelling ***Treatments tried ***Previous symptoms ***Insect or bug bites  Fever : no Vision changes: No Sore throat: no   Shortness of breath: no Rhinorrhea: no Appetite: normal  Hydration: normal  Abdominal pain: no Nausea: no Vomiting: no Diarrhea: no Dysuria: no  Sleep disturbance: no Arthralgias: no Headache: no   History reviewed. No pertinent past medical history.  There are no problems to display for this patient.   Past Surgical History:  Procedure Laterality Date   TESTICLE TORSION REDUCTION Left 01/01/2022   Procedure: TESTICULAR TORSION REPAIR;  Surgeon: Hollice Espy, MD;  Location: [facility] ORS;  Service: Urology;  Laterality: Left;       Home Medications    Prior to Admission medications   Medication Sig Start Date End Date Taking? Authorizing Provider  cetirizine (ZYRTEC) 5 MG chewable tablet Chew by mouth. 01/23/11  Yes [provider]    Family History History reviewed. No pertinent family history.  Social History Social History   Tobacco Use   Smoking status: Never   Smokeless tobacco: Never  Vaping Use   Vaping Use: Never used  Substance Use Topics   Alcohol use: Not Currently   Drug use: Never     Allergies   Fire ant, Peanut-containing drug products, Other, and Peanut oil   Review of Systems Review of Systems :negative unless otherwise stated in HPI.      Physical Exam Triage Vital Signs ED Triage Vitals  Enc Vitals Group     BP 01/09/23 0929 (!) 150/84      Pulse Rate 01/09/23 0929 60     Resp 01/09/23 0929 15     Temp 01/09/23 0929 98.2 F (36.8 C)     Temp Source 01/09/23 0929 Oral     SpO2 01/09/23 0929 100 %     Weight 01/09/23 0928 160 lb (72.6 kg)     Height 01/09/23 0928 '5\' 11"'$  (1.803 m)     Head Circumference --      Peak Flow --      Pain Score 01/09/23 0927 0     Pain Loc --      Pain Edu? --      Excl. in [place]? --    No data found.  Updated Vital Signs BP (!) 150/84 (BP Location: Left Arm)   Pulse 60   Temp 98.2 F (36.8 C) (Oral)   Resp 15   Ht '5\' 11"'$  (1.803 m)   Wt 72.6 kg   SpO2 100%   BMI 22.32 kg/m   Visual Acuity Right Eye Distance:   Left Eye Distance:   Bilateral Distance:    Right Eye Near:   Left Eye Near:    Bilateral Near:     Physical Exam  GEN: alert, well appearing male, in no acute distress *** EYES: extra occular movements intact, no scleral injection*** CV: regular rate ***and rhythm RESP: no increased work of breathing, ***clear to  ascultation bilaterally MSK: no extremity edema, no gross deformities NEURO: alert, moves all extremities appropriately, normal gait PSYCH: Normal affect, appropriate speech and behavior  SKIN: warm and dry; ***   ***pic  UC Treatments / Results  Labs (all labs ordered are listed, but only abnormal results are displayed) Labs Reviewed - No data to display  EKG   Radiology No results found.  Procedures Procedures (including critical care time)  Medications Ordered in UC Medications - No data to display  Initial Impression / Assessment and Plan / UC Course  I have reviewed the triage vital signs and the nursing notes.  Pertinent labs & imaging results that were available during my care of the patient were reviewed by me and considered in my medical decision making (see chart for details).     Patient is a 22 y.o. malewho presents for***.  Overall, patient is well-appearing and well-hydrated.  Vital signs stable.  Iktan is afebrile.   Exam concerning for ***.  Treat with***steroid ointment. ***No sign of infection to suggest antibiotics at this time.     Reviewed expectations regarding course of current medical issues.  All questions asked were answered.  Outlined signs and symptoms indicating need for more acute intervention. Patient verbalized understanding. After Visit Summary given.   Final Clinical Impressions(s) / UC Diagnoses   Final diagnoses:  None   Discharge Instructions   None    ED Prescriptions   None    PDMP not reviewed this encounter.

## 2023-03-01 ENCOUNTER — Ambulatory Visit
Admission: EM | Admit: 2023-03-01 | Discharge: 2023-03-01 | Disposition: A | Discharge status: Home / Self Care | Payer: 59 | Attending: Family Medicine | Admitting: Family Medicine

## 2023-03-01 ENCOUNTER — Ambulatory Visit (INDEPENDENT_AMBULATORY_CARE_PROVIDER_SITE_OTHER): Payer: 59

## 2023-03-01 DIAGNOSIS — S39012A Strain of muscle, fascia and tendon of lower back, initial encounter: Secondary | ICD-10-CM

## 2023-03-01 MED ORDER — NAPROXEN 500 MG PO TABS: 500.0000 mg | ORAL_TABLET | Freq: Two times a day (BID) | ORAL | 0 refills | Status: AC

## 2023-03-01 MED ORDER — METHOCARBAMOL 500 MG PO TABS: 500.0000 mg | ORAL_TABLET | Freq: Two times a day (BID) | ORAL | 0 refills | Status: AC

## 2023-03-01 MED ORDER — NAPROXEN 500 MG PO TABS: 500.0000 mg | ORAL_TABLET | Freq: Two times a day (BID) | ORAL | 0 refills | Status: DC

## 2023-03-01 MED ORDER — METHOCARBAMOL 500 MG PO TABS: 500.0000 mg | ORAL_TABLET | Freq: Two times a day (BID) | ORAL | 0 refills | Status: DC

## 2023-03-01 NOTE — ED Triage Notes (Signed)
Pt c/o R lower back pain x5 days, denies any falls or injuries. Pain worse when sitting,standing or bending over. No otc tx.

## 2023-03-01 NOTE — Discharge Instructions (Addendum)
Your xray did not show a fracture or any bone misalignment.  I suspect you strained the muscles of your lower right back.   If medication was prescribed, stop by the pharmacy to pick up your prescriptions.  For your back pain, Take 1500 mg Tylenol twice a day, take muscle relaxer (Robaxin) twice a day, take Naprosyn twice a day,  as needed for pain. Consider stopping by the pharmacy or dollar store to pick up some Lidocaine patches. Apply for 12 hours and then remove.   Watch for worsening symptoms such as an increasing weakness or loss of sensation in your arms or legs, increasing pain and/or the loss of bladder or bowel function. Should any of these occur, go to the emergency department immediately.

## 2023-03-01 NOTE — ED Provider Notes (Signed)
[facility]    CSN: [id number] Arrival date & time: 03/01/23  [id number]      History   Chief Complaint Chief Complaint  Patient presents with   Back Pain    HPI  HPI Joseph Mullen is a 22 y.o. male.   Jalenp presents for low back pain that started 5 days ago.  He got a tattoo in mid-March and then went back to the gym after taking about a week off he gradually started lifting heavier weights.   He is unsure if he injured his back with lifting. Continues to have achy-tingly  pain with movement. Pain is better with laying down and worse with bending over, twisting and side to side. His pain in more on the right side. No known previous injury but has had pain in the area before.  He has been icing, warming, resting, stretching the area without relief. No pain relievers tried. Pain is tingling down his hamstring and buttocks.  He has trouble walking at first but pain decreases 2/10. Pain with walking or movement 6/10.  Denies weakness, numbness, tingling, dysuria, abdominal pain, chest pain, incontinence, perianal numbness.   History reviewed. No pertinent past medical history.  There are no problems to display for this patient.   Past Surgical History:  Procedure Laterality Date   TESTICLE TORSION REDUCTION Left 01/01/2022   Procedure: TESTICULAR TORSION REPAIR;  Surgeon: Vanna Scotland, MD;  Location: [facility] ORS;  Service: Urology;  Laterality: Left;       Home Medications    Prior to Admission medications   Medication Sig Start Date End Date Taking? Authorizing Provider  methocarbamol (ROBAXIN) 500 MG tablet Take 1 tablet (500 mg total) by mouth 2 (two) times daily. 03/01/23  Yes Antwine Agosto, DO  naproxen (NAPROSYN) 500 MG tablet Take 1 tablet (500 mg total) by mouth 2 (two) times daily with a meal. 03/01/23  Yes Mozella Rexrode, DO  cetirizine (ZYRTEC) 5 MG chewable tablet Chew by mouth. 01/23/11   [provider]    Family History History reviewed. No  pertinent family history.  Social History Social History   Tobacco Use   Smoking status: Never   Smokeless tobacco: Never  Vaping Use   Vaping Use: Never used  Substance Use Topics   Alcohol use: Not Currently   Drug use: Never     Allergies   Fire ant, Peanut-containing drug products, Other, and Peanut oil   Review of Systems Review of Systems: egative unless otherwise stated in HPI.      Physical Exam Triage Vital Signs ED Triage Vitals  Enc Vitals Group     BP 03/01/23 0822 124/76     Pulse Rate 03/01/23 0822 60     Resp 03/01/23 0822 16     Temp 03/01/23 0822 99.3 F (37.4 C)     Temp Source 03/01/23 0822 Oral     SpO2 03/01/23 0822 98 %     Weight 03/01/23 0822 160 lb (72.6 kg)     Height 03/01/23 0822 6' (1.829 m)     Head Circumference --      Peak Flow --      Pain Score 03/01/23 0829 6     Pain Loc --      Pain Edu? --      Excl. in [place]? --    No data found.  Updated Vital Signs BP 124/76 (BP Location: Left Arm)   Pulse 60   Temp 99.3 F (37.4 C) (Oral)  Resp 16   Ht 6' (1.829 m)   Wt 72.6 kg   SpO2 98%   BMI 21.70 kg/m   Visual Acuity Right Eye Distance:   Left Eye Distance:   Bilateral Distance:    Right Eye Near:   Left Eye Near:    Bilateral Near:     Physical Exam GEN: well appearing male in no acute distress  CVS: well perfused, regular rate   RESP: speaking in full sentences without pause, no respiratory distress  MSK:  Spine: - Inspection: no gross deformity or asymmetry, swelling or ecchymosis. No skin changes  - Palpation: No TTP over the C, T or L spinous processes, no cervical, thoracic bilateral paraspinal tenderness,  right lumbar paraspinal muscles, no SI joint tenderness bilaterally, no piriformis tenderness  - ROM: full active ROM of the thoracic, cervical and lumbar spine in rotation, flexion and extension but with mild pain with flexion/extension of lumbar spine  - Strength: 5/5 strength of lower extremity in  L4-S1 nerve root distributions b/l - Neuro: sensation intact in the L4-S1 nerve root distribution b/l - Special testing: Negative straight leg raise SKIN: warm, dry, no overly skin rash or erythema    UC Treatments / Results  Labs (all labs ordered are listed, but only abnormal results are displayed) Labs Reviewed - No data to display  EKG   Radiology DG Lumbar Spine Complete  Result Date: 03/01/2023 CLINICAL DATA:  Lower back pain for 5 days. EXAM: LUMBAR SPINE - COMPLETE 4+ VIEW COMPARISON:  None Available. FINDINGS: There are 5 non-rib-bearing lumbar-type vertebral bodies. Vertebral body heights are preserved. Alignment is normal. There is no evidence of spondylolysis. The disc heights are preserved. The soft tissues are unremarkable. The SI joints are intact. IMPRESSION: Normal lumbar spine radiographs. Electronically Signed   By: Lesia Hausen M.D.   On: 03/01/2023 09:14     Procedures Procedures (including critical care time)  Medications Ordered in UC Medications - No data to display  Initial Impression / Assessment and Plan / UC Course  I have reviewed the triage vital signs and the nursing notes.  Pertinent labs & imaging results that were available during my care of the patient were reviewed by me and considered in my medical decision making (see chart for details).      Pt is a 22 y.o.  male with 5 days of low back pain after lifting weights at the gym.    Exam is concerning for a musculoskeletal injury of lumbar spine.   After shared decision make we will obtain lumbar plain films.  Xray personally interpreted by me were unremarkable for fracture, significant malalignment or bony dislocation.  Radiologist report reviewed and notes maintained disc height.  Patient to gradually return to normal activities, as tolerated and continue ordinary activities within the limits permitted by pain. Prescribed Naproxen sodium  and muscle relaxer  for pain relief.  Advised patient to  avoid other NSAIDs while taking Naprosyn. Tylenol and Lidocaine patches PRN for multimodal pain relief. Counseled patient on red flag symptoms and when to seek immediate care.  No red flags suggesting cauda equina syndrome or progressive major motor weakness. Patient to follow up with orthopedic provider if symptoms do not improve with conservative treatment.  Return and ED precautions given.    Discussed MDM, treatment plan and plan for follow-up with patient/parent who agrees with plan.   Final Clinical Impressions(s) / UC Diagnoses   Final diagnoses:  Strain of lumbar region, initial encounter  Discharge Instructions      Your xray did not show a fracture or any bone misalignment.  I suspect you strained the muscles of your lower right back.   If medication was prescribed, stop by the pharmacy to pick up your prescriptions.  For your back pain, Take 1500 mg Tylenol twice a day, take muscle relaxer (Robaxin) twice a day, take Naprosyn twice a day,  as needed for pain. Consider stopping by the pharmacy or dollar store to pick up some Lidocaine patches. Apply for 12 hours and then remove.   Watch for worsening symptoms such as an increasing weakness or loss of sensation in your arms or legs, increasing pain and/or the loss of bladder or bowel function. Should any of these occur, go to the emergency department immediately.       ED Prescriptions     Medication Sig Dispense Auth. Provider   naproxen (NAPROSYN) 500 MG tablet Take 1 tablet (500 mg total) by mouth 2 (two) times daily with a meal. 30 tablet Revan Gendron, DO   methocarbamol (ROBAXIN) 500 MG tablet Take 1 tablet (500 mg total) by mouth 2 (two) times daily. 20 tablet Katha Cabal, DO      PDMP not reviewed this encounter.   Katha Cabal, DO 03/01/23 [phone]

## 2023-11-11 IMAGING — US US SCROTUM W/ DOPPLER COMPLETE
1 series · 13 of 25 positions shown · non-contrast
Comparison: None.

CLINICAL DATA: Left testicular pain and swelling. Sudden onset this
morning.

EXAM:
SCROTAL ULTRASOUND
DOPPLER ULTRASOUND OF THE TESTICLES
TECHNIQUE: Complete ultrasound examination of the testicles, epididymis, and
other scrotal structures was performed. Color and spectral Doppler
ultrasound were also utilized to evaluate blood flow to the
testicles.

[Series 1: us scrotum w/doppler · 13 of 55 slices shown]
[im 1/55]
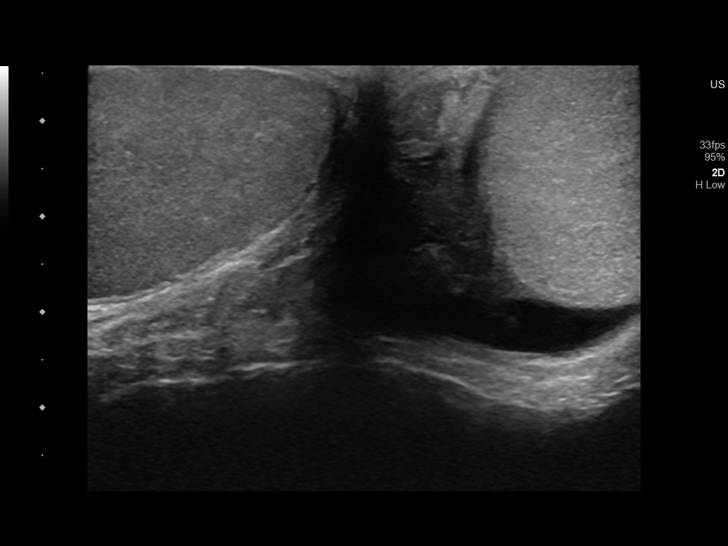
[im 5/55]
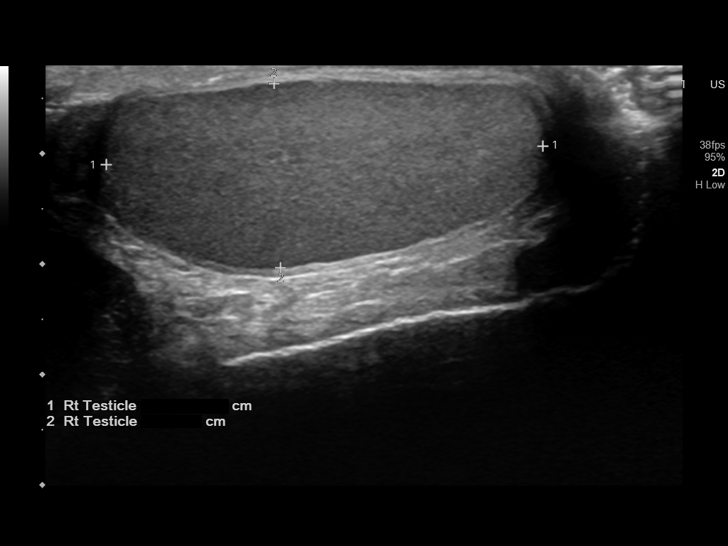
[im 10/55]
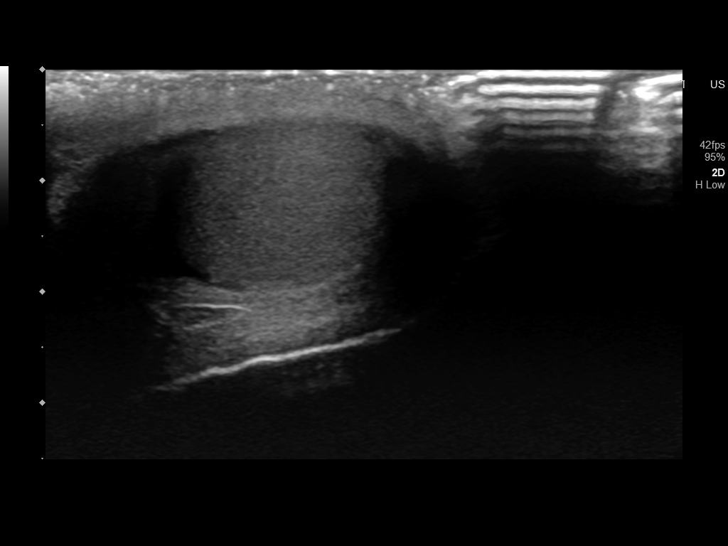
[im 14/55]
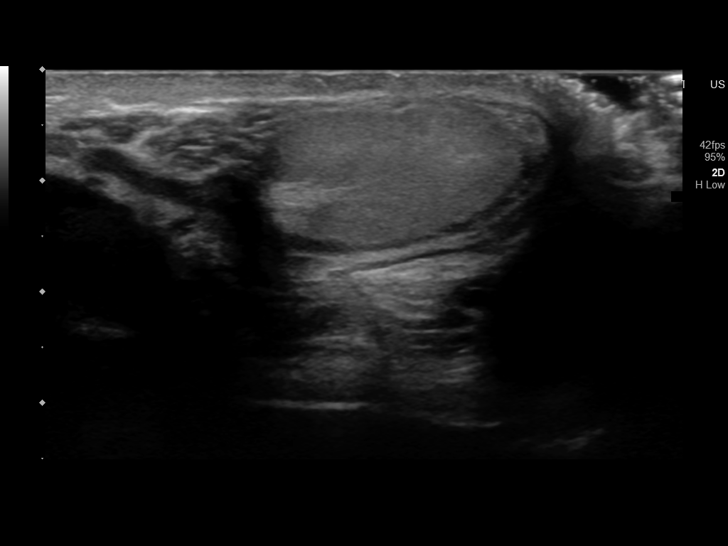
[im 19/55]
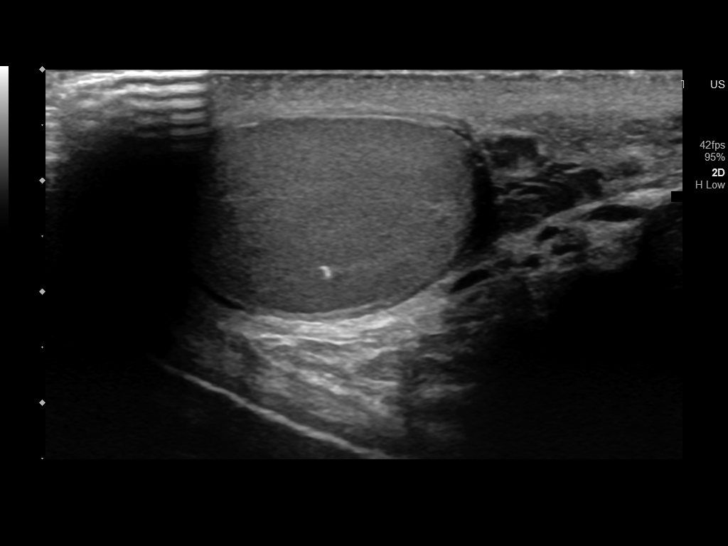
[im 23/55]
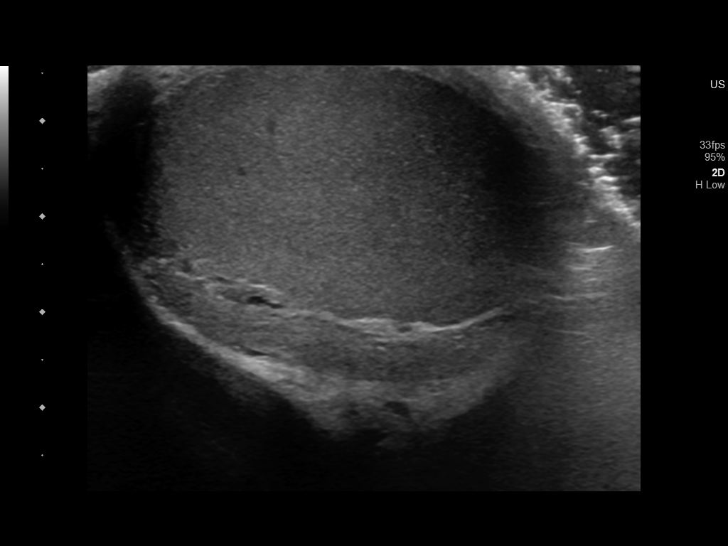
[im 28/55]
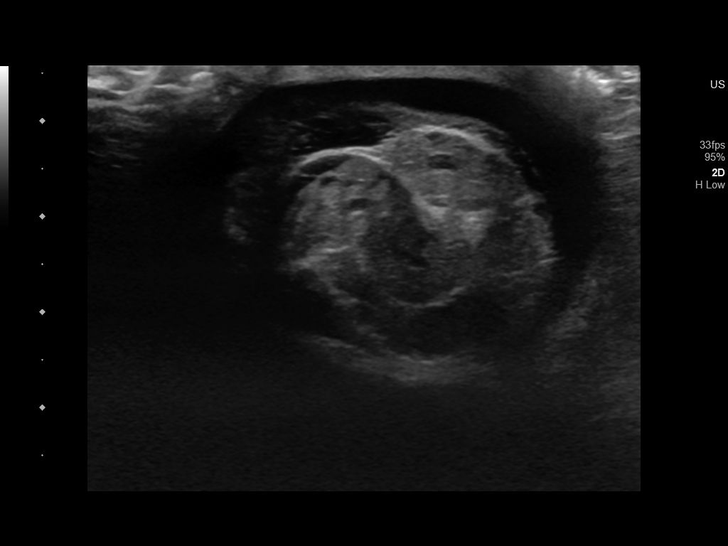
[im 32/55]
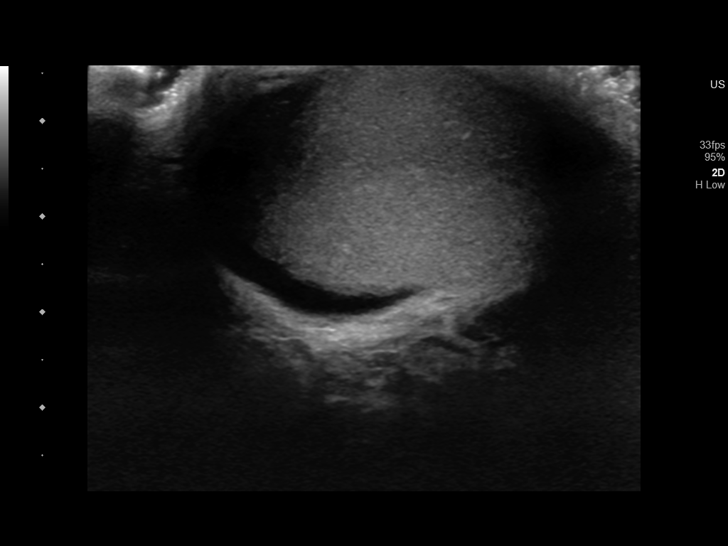
[im 37/55]
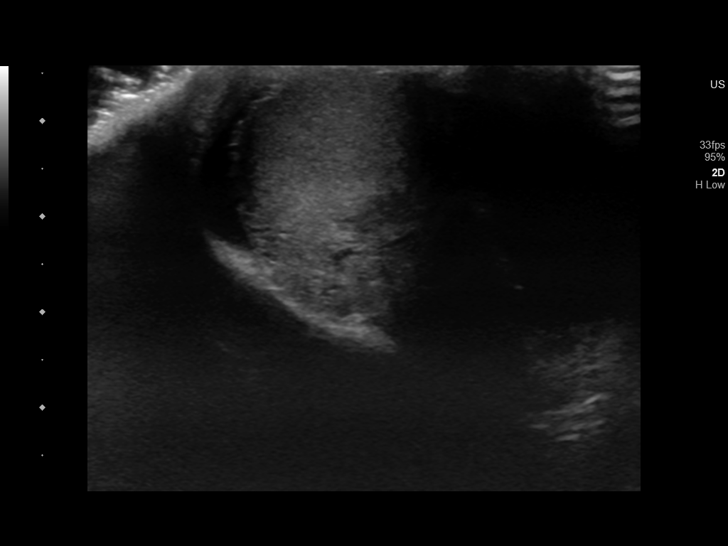
[im 41/55]
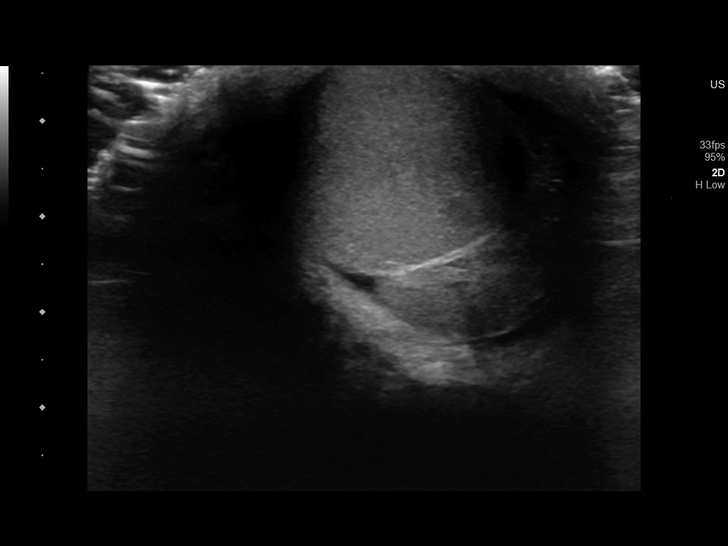
[im 46/55]
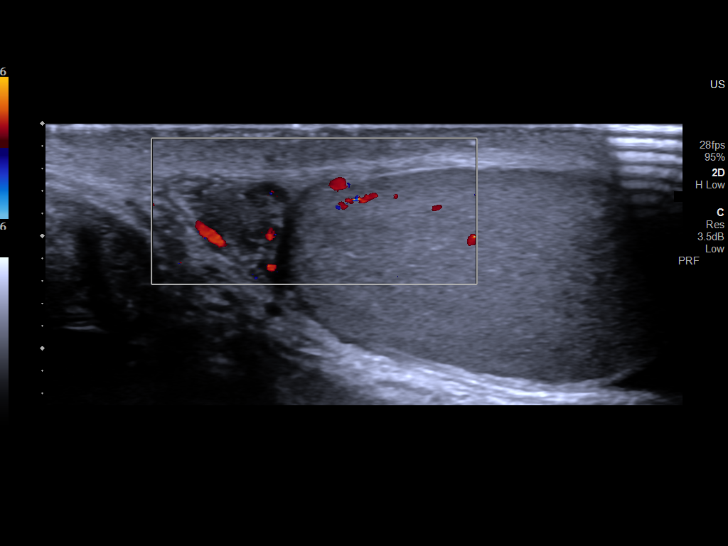
[im 50/55]
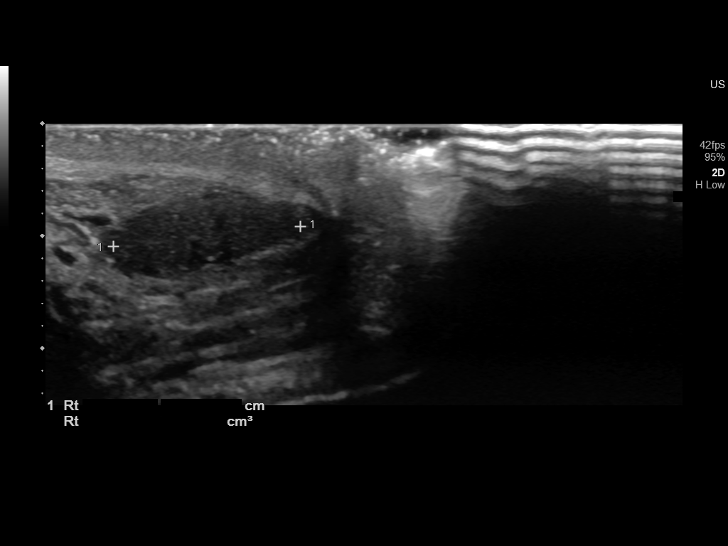
[im 55/55]
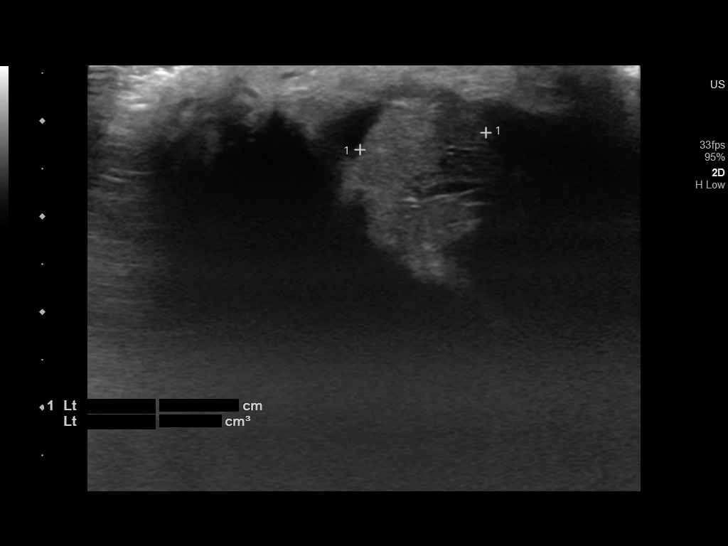

[13 of 25 positions shown; findings below may reference images not displayed]

FINDINGS: Right testicle

Measurements: 4.0 x 1.7 x 2.2 cm. No mass or microlithiasis
visualized.

Left testicle

Measurements: 4.0 x 2.7 x 2.5 cm. No mass or microlithiasis
visualized.

Right epididymis:  Normal in size and appearance.

Left epididymis:  Normal in size and appearance.

Hydrocele:  Small volume fluid noted left hemiscrotum.

Varicocele:  None visualized.

Pulsed Doppler interrogation of both testes demonstrates normal low
resistance arterial and venous waveforms in the right testicle. No
evidence for flow signal in the left testicular parenchyma on color
or power doppler imaging. No measurable arterial or venous waveform
within the parenchyma of the left testicle on Pulsed Doppler
evaluation.
IMPRESSION: 1. Lack of detectable blood flow in the left testicle, compatible
with torsion.
2. Unremarkable appearance of the right testicle.

Critical Value/emergent results were called by telephone at the time
of interpretation on 01/01/2022 at [DATE] to provider [HOSPITAL]
[facility] , who verbally acknowledged these results.
